# Patient Record
Sex: Female | Born: 2000 | Race: Black or African American | Hispanic: No | Marital: Single | Smoking: Former smoker
Health system: Southern US, Community
[De-identification: ages and names within clinical notes are randomized; demographics above are authoritative.]

## PROBLEM LIST (undated history)

## (undated) DIAGNOSIS — A6000 Herpesviral infection of urogenital system, unspecified: Secondary | ICD-10-CM

## (undated) HISTORY — PX: SHOULDER SURGERY: SHX246

## (undated) HISTORY — PX: FRACTURE SURGERY: SHX138

---
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
## 2018-02-06 ENCOUNTER — Ambulatory Visit (HOSPITAL_COMMUNITY)
Admission: EM | Admit: 2018-02-06 | Discharge: 2018-02-06 | Disposition: A | Discharge status: Home / Self Care | Payer: Managed Care, Other (non HMO) | Attending: Family Medicine | Admitting: Family Medicine

## 2018-02-06 ENCOUNTER — Encounter (HOSPITAL_COMMUNITY): Payer: Self-pay | Admitting: Emergency Medicine

## 2018-02-06 DIAGNOSIS — N760 Acute vaginitis: Secondary | ICD-10-CM | POA: Diagnosis not present

## 2018-02-06 DIAGNOSIS — B9689 Other specified bacterial agents as the cause of diseases classified elsewhere: Secondary | ICD-10-CM | POA: Insufficient documentation

## 2018-02-06 DIAGNOSIS — Z3202 Encounter for pregnancy test, result negative: Secondary | ICD-10-CM

## 2018-02-06 DIAGNOSIS — Z113 Encounter for screening for infections with a predominantly sexual mode of transmission: Secondary | ICD-10-CM

## 2018-02-06 DIAGNOSIS — Z202 Contact with and (suspected) exposure to infections with a predominantly sexual mode of transmission: Secondary | ICD-10-CM | POA: Insufficient documentation

## 2018-02-06 LAB — POCT PREGNANCY, URINE: Preg Test, Ur: NEGATIVE

## 2018-02-06 NOTE — ED Notes (Signed)
Clean & dirty urine specimens obtained and in lab 

## 2018-02-06 NOTE — ED Provider Notes (Signed)
[facility]    CSN: [id number] Arrival date & time: 02/06/18  1544     History   Chief Complaint Chief Complaint  Patient presents with  . Exposure to STD    HPI Rachel Travis is a 17 y.o. female.   17 year old female comes in for STD testing.  Patient states she was told by her partner that he was  tested positive for HSV.  Patient denies any vaginal discharge, itching, pain.  Denies vaginal lesions.  Denies abdominal pain, nausea, vomiting.  Denies urinary symptoms such as frequency, dysuria, hematuria.  Sexually active with one partner, no condom use.         History reviewed. No pertinent past medical history.  There are no active problems to display for this patient.   History reviewed. No pertinent surgical history.  OB History    No data available       Home Medications    Prior to Admission medications   Medication Sig Start Date End Date Taking? Authorizing Provider  benzonatate (TESSALON) 100 MG capsule Take 1 capsule (100 mg total) by mouth every 8 (eight) hours. 12/25/17   McDonald, Mia A, PA-C  EPINEPHrine 0.3 mg/0.3 mL IJ SOAJ injection Inject 1 mg once into the muscle.    [provider]  ibuprofen ([employer] IBUPROFEN) 200 MG tablet Take 400 mg 2 (two) times daily as needed by mouth for mild pain.    [provider]  PE-Doxylamine-DM-GG-APAP (VICKS DAYQUIL/NYQUIL SEVERE PO) Take 30 mLs 3 (three) times daily as needed by mouth (cough/flu symptoms).    [provider]  promethazine-dextromethorphan (PROMETHAZINE-DM) 6.25-15 MG/5ML syrup Take 5 mLs by mouth 4 (four) times daily as needed for cough. 12/25/17   McDonald, Mia A, PA-C    Family History No family history on file.  Social History Social History   Tobacco Use  . Smoking status: Current Every Day Smoker    Packs/day: 0.15    Types: Cigarettes  . Smokeless tobacco: Never Used  Substance Use Topics  . Alcohol use: No    Frequency: Never  . Drug use: No       Allergies   Peanut-containing drug products   Review of Systems Review of Systems  Reason unable to perform ROS: See HPI as above.     Physical Exam Triage Vital Signs ED Triage Vitals [02/06/18 1637]  Enc Vitals Group     BP 134/81     Pulse Rate 100     Resp 16     Temp 98.3 F (36.8 C)     Temp Source Temporal     SpO2 100 %     Weight 125 lb (56.7 kg)     Height      Head Circumference      Peak Flow      Pain Score 0     Pain Loc      Pain Edu?      Excl. in GC?    No data found.  Updated Vital Signs BP 134/81   Pulse 100   Temp 98.3 F (36.8 C) (Temporal)   Resp 16   Wt 125 lb (56.7 kg)   SpO2 100%   BMI 24.41 kg/m   Physical Exam  Constitutional: She is oriented to person, place, and time. She appears well-developed and well-nourished. No distress.  HENT:  Head: Normocephalic and atraumatic.  Eyes: Conjunctivae are normal. Pupils are equal, round, and reactive to light.  Genitourinary: There is no  rash, tenderness or lesion on the right labia. There is no rash, tenderness or lesion on the left labia.  Neurological: She is alert and oriented to person, place, and time.    UC Treatments / Results  Labs (all labs ordered are listed, but only abnormal results are displayed) Labs Reviewed  HIV ANTIBODY (ROUTINE TESTING)  HSV 1 ANTIBODY, IGG  HSV 2 ANTIBODY, IGG  RPR  POCT PREGNANCY, URINE  URINE CYTOLOGY ANCILLARY ONLY    EKG  EKG Interpretation None       Radiology No results found.  Procedures Procedures (including critical care time)  Medications Ordered in UC Medications - No data to display   Initial Impression / Assessment and Plan / UC Course  I have reviewed the triage vital signs and the nursing notes.  Pertinent labs & imaging results that were available during my care of the patient were reviewed by me and considered in my medical decision making (see chart for details).     Patient requested examination of  the labia for HSV, though denies painful ulceration/lesions. No ulceration/lesions seen. Will provide blood work, discussed that this cannot determine time of infection and does not mean active outbreak, patient expresses understanding. She requested further STD testing. Cytology and blood work sent, patient will be contacted with any positive results that require additional treatment. Patient to refrain from sexual activity for the next 7 days. Return precautions given.    Final Clinical Impressions(s) / UC Diagnoses   Final diagnoses:  Exposure to STD    ED Discharge Orders    None        Lurline IdolYu, Amy V, PA-C 02/06/18 1802

## 2018-02-06 NOTE — ED Triage Notes (Signed)
PT's partner had a blood test for herpes. He was positive for HSV 2. PT has had unprotected sex with him since October. PT has never had an outbreak.

## 2018-02-06 NOTE — Discharge Instructions (Signed)
Urine pregnancy negative. Blood work and cytology sent, you will be contacted with any positive results that requires further treatment. Refrain from sexual activity for the next 7 days. Monitor for any worsening of symptoms, fever, abdominal pain, nausea, vomiting, to follow up for reevaluation. Follow up with GYN for further management and evaluation needed.

## 2018-02-09 LAB — URINE CYTOLOGY ANCILLARY ONLY
Chlamydia: NEGATIVE
Neisseria Gonorrhea: NEGATIVE
Trichomonas: NEGATIVE

## 2018-02-11 LAB — URINE CYTOLOGY ANCILLARY ONLY: CANDIDA VAGINITIS: NEGATIVE

## 2018-02-12 ENCOUNTER — Telehealth (HOSPITAL_COMMUNITY): Payer: Self-pay | Admitting: Emergency Medicine

## 2018-02-13 LAB — HIV ANTIBODY (ROUTINE TESTING W REFLEX): HIV SCREEN 4TH GENERATION: NONREACTIVE

## 2018-02-13 LAB — RPR: RPR Ser Ql: UNDETERMINED

## 2018-02-14 LAB — HSV 2 ANTIBODY, IGG: HSV 2 Glycoprotein G Ab, IgG: 5.74 index — ABNORMAL HIGH (ref 0.00–0.90)

## 2018-02-14 LAB — HSV 1 ANTIBODY, IGG: HSV 1 GLYCOPROTEIN G AB, IGG: 14.2 {index} — AB (ref 0.00–0.90)

## 2018-09-28 ENCOUNTER — Emergency Department (HOSPITAL_COMMUNITY)
Admission: EM | Admit: 2018-09-28 | Discharge: 2018-09-28 | Disposition: A | Discharge status: Home / Self Care | Payer: Managed Care, Other (non HMO) | Attending: Emergency Medicine | Admitting: Emergency Medicine

## 2018-09-28 ENCOUNTER — Encounter (HOSPITAL_COMMUNITY): Payer: Self-pay | Admitting: Emergency Medicine

## 2018-09-28 ENCOUNTER — Emergency Department (HOSPITAL_COMMUNITY): Payer: Managed Care, Other (non HMO)

## 2018-09-28 DIAGNOSIS — R0981 Nasal congestion: Secondary | ICD-10-CM | POA: Insufficient documentation

## 2018-09-28 DIAGNOSIS — F17210 Nicotine dependence, cigarettes, uncomplicated: Secondary | ICD-10-CM | POA: Insufficient documentation

## 2018-09-28 DIAGNOSIS — A6004 Herpesviral vulvovaginitis: Secondary | ICD-10-CM | POA: Diagnosis not present

## 2018-09-28 DIAGNOSIS — Z91010 Allergy to peanuts: Secondary | ICD-10-CM | POA: Insufficient documentation

## 2018-09-28 DIAGNOSIS — J069 Acute upper respiratory infection, unspecified: Secondary | ICD-10-CM | POA: Insufficient documentation

## 2018-09-28 DIAGNOSIS — R05 Cough: Secondary | ICD-10-CM | POA: Diagnosis present

## 2018-09-28 HISTORY — DX: Herpesviral infection of urogenital system, unspecified: A60.00

## 2018-09-28 MED ORDER — HYDROCORTISONE 1 % EX CREA: TOPICAL_CREAM | CUTANEOUS | 0 refills | Status: AC

## 2018-09-28 MED ORDER — VALACYCLOVIR HCL 1 G PO TABS: 1000.0000 mg | ORAL_TABLET | Freq: Two times a day (BID) | ORAL | 0 refills | Status: AC

## 2018-09-28 NOTE — ED Notes (Signed)
Patient given discharge teaching and verbalized understanding. Patient ambulated out of ED with a steady gait. 

## 2018-09-28 NOTE — ED Triage Notes (Signed)
Pt thinks she has the flu. For several days had cough that is productive with greenish phlegm, congestion, chills. Reports chest pains are worse when laying down at night.  Also states that she is having a vaginal Herpes breakout. Was dx with herpes here last year.

## 2018-10-08 NOTE — ED Provider Notes (Signed)
[facility]-EMERGENCY DEPT Provider Note   CSN: [id number] Arrival date & time: 09/28/18  1347     History   Chief Complaint Chief Complaint  Patient presents with  . Cough  . Chills  . Herpes Zoster    HPI Rachel Travis is a 17 y.o. female.  HPI 17 year old female with several complaints.  For the past few days she has had cough which productive for yellow/green sputum.  She is felt congested.  Subjective fever and chills.  She is additionally concerned of genital herpes outbreak.  Known history of the same.  Began having recurrence of symptoms in the past week.  Pain and burning.  Past Medical History:  Diagnosis Date  . Herpes genitalia     There are no active problems to display for this patient.   Past Surgical History:  Procedure Laterality Date  . FRACTURE SURGERY    . SHOULDER SURGERY Left      OB History   None      Home Medications    Prior to Admission medications   Medication Sig Start Date End Date Taking? Authorizing Provider  guaiFENesin (MUCINEX) 600 MG 12 hr tablet Take 600 mg by mouth 2 (two) times daily as needed for cough.   Yes [provider]  benzonatate (TESSALON) 100 MG capsule Take 1 capsule (100 mg total) by mouth every 8 (eight) hours. Patient not taking: Reported on 09/28/2018 12/25/17   McDonald, Mia A, PA-C  EPINEPHrine 0.3 mg/0.3 mL IJ SOAJ injection Inject 1 mg once into the muscle.    [provider]  hydrocortisone cream 1 % Apply to affected area 2 times daily 09/28/18   Raeford Razor, MD  promethazine-dextromethorphan (PROMETHAZINE-DM) 6.25-15 MG/5ML syrup Take 5 mLs by mouth 4 (four) times daily as needed for cough. Patient not taking: Reported on 09/28/2018 12/25/17   McDonald, Pedro Earls A, PA-C  valACYclovir (VALTREX) 1000 MG tablet Take 1 tablet (1,000 mg total) by mouth 2 (two) times daily for 10 days. 09/28/18 10/08/18  Raeford Razor, MD    Family History No family history on  file.  Social History Social History   Tobacco Use  . Smoking status: Current Every Day Smoker    Packs/day: 0.15    Types: Cigarettes  . Smokeless tobacco: Never Used  Substance Use Topics  . Alcohol use: No    Frequency: Never  . Drug use: No     Allergies   Peanut-containing drug products   Review of Systems Review of Systems  All systems reviewed and negative, other than as noted in HPI.  Physical Exam Updated Vital Signs BP 111/63   Pulse 74   Temp 98.4 F (36.9 C) (Oral)   Resp 15   Ht 5' (1.524 m)   Wt 54.4 kg   SpO2 100%   BMI 23.44 kg/m   Physical Exam  Constitutional: She appears well-developed and well-nourished. No distress.  HENT:  Head: Normocephalic and atraumatic.  Eyes: Conjunctivae are normal. Right eye exhibits no discharge. Left eye exhibits no discharge.  Neck: Neck supple.  Cardiovascular: Normal rate, regular rhythm and normal heart sounds. Exam reveals no gallop and no friction rub.  No murmur heard. Pulmonary/Chest: Effort normal and breath sounds normal. No respiratory distress.  Abdominal: Soft. She exhibits no distension. There is no tenderness.  Musculoskeletal: She exhibits no edema or tenderness.  Neurological: She is alert.  Skin: Skin is warm and dry.  Psychiatric: She has a normal mood and affect. Her  behavior is normal. Thought content normal.  Nursing note and vitals reviewed.    ED Treatments / Results  Labs (all labs ordered are listed, but only abnormal results are displayed) Labs Reviewed - No data to display  EKG EKG Interpretation  Date/Time:  Monday September 28 2018 14:05:05 EST Ventricular Rate:  84 PR Interval:    QRS Duration: 77 QT Interval:  339 QTC Calculation: 401 R Axis:   83 Text Interpretation:  Sinus rhythm Minimal ST depression, inferior leads Confirmed by Raeford RazorKohut, Caylie Sandquist [phone]([id number]) on 09/28/2018 4:55:19 PM   Radiology No results found.   Dg Chest 2 View  Result Date: 09/28/2018 CLINICAL  DATA:  Cough, congestion and fever for several days. EXAM: CHEST - 2 VIEW COMPARISON:  12/25/2017 FINDINGS: The cardiomediastinal silhouette is unremarkable. There is no evidence of focal airspace disease, pulmonary edema, suspicious pulmonary nodule/mass, pleural effusion, or pneumothorax. No acute bony abnormalities are identified. IMPRESSION: No active cardiopulmonary disease. Electronically Signed   By: Harmon PierJeffrey  Hu M.D.   On: 09/28/2018 14:20    Procedures Procedures (including critical care time)  Medications Ordered in ED Medications - No data to display   Initial Impression / Assessment and Plan / ED Course  I have reviewed the triage vital signs and the nursing notes.  Pertinent labs & imaging results that were available during my care of the patient were reviewed by me and considered in my medical decision making (see chart for details).     17 year old female with likely viral URI.  Systematic treatment.  Additionally provided with Alphatrex given no history of genital herpes and recurrence of symptoms.  Final Clinical Impressions(s) / ED Diagnoses   Final diagnoses:  Upper respiratory tract infection, unspecified type  Herpes simplex vulvovaginitis    ED Discharge Orders         Ordered    valACYclovir (VALTREX) 1000 MG tablet  2 times daily     09/28/18 1739    hydrocortisone cream 1 %     09/28/18 1739           Raeford RazorKohut, Providencia Hottenstein, MD 10/08/18 1225

## 2021-09-29 ENCOUNTER — Other Ambulatory Visit: Payer: Self-pay

## 2021-09-29 ENCOUNTER — Emergency Department (HOSPITAL_BASED_OUTPATIENT_CLINIC_OR_DEPARTMENT_OTHER)
Admission: EM | Admit: 2021-09-29 | Discharge: 2021-09-29 | Disposition: A | Discharge status: Home / Self Care | Payer: Managed Care, Other (non HMO) | Attending: Emergency Medicine | Admitting: Emergency Medicine

## 2021-09-29 ENCOUNTER — Encounter (HOSPITAL_BASED_OUTPATIENT_CLINIC_OR_DEPARTMENT_OTHER): Payer: Self-pay | Admitting: *Deleted

## 2021-09-29 DIAGNOSIS — R059 Cough, unspecified: Secondary | ICD-10-CM | POA: Insufficient documentation

## 2021-09-29 DIAGNOSIS — R0602 Shortness of breath: Secondary | ICD-10-CM | POA: Diagnosis not present

## 2021-09-29 DIAGNOSIS — Z87891 Personal history of nicotine dependence: Secondary | ICD-10-CM | POA: Diagnosis not present

## 2021-09-29 DIAGNOSIS — M791 Myalgia, unspecified site: Secondary | ICD-10-CM | POA: Diagnosis not present

## 2021-09-29 DIAGNOSIS — R0981 Nasal congestion: Secondary | ICD-10-CM | POA: Insufficient documentation

## 2021-09-29 DIAGNOSIS — R509 Fever, unspecified: Secondary | ICD-10-CM | POA: Diagnosis not present

## 2021-09-29 DIAGNOSIS — Z20822 Contact with and (suspected) exposure to covid-19: Secondary | ICD-10-CM | POA: Insufficient documentation

## 2021-09-29 DIAGNOSIS — Z91010 Allergy to peanuts: Secondary | ICD-10-CM | POA: Insufficient documentation

## 2021-09-29 DIAGNOSIS — R051 Acute cough: Secondary | ICD-10-CM

## 2021-09-29 DIAGNOSIS — R519 Headache, unspecified: Secondary | ICD-10-CM | POA: Diagnosis not present

## 2021-09-29 LAB — RESP PANEL BY RT-PCR (FLU A&B, COVID) ARPGX2
Influenza A by PCR: NEGATIVE
Influenza B by PCR: NEGATIVE
SARS Coronavirus 2 by RT PCR: NEGATIVE

## 2021-09-29 MED ORDER — BENZONATATE 100 MG PO CAPS: 100.0000 mg | ORAL_CAPSULE | Freq: Three times a day (TID) | ORAL | 0 refills | Status: DC

## 2021-09-29 MED ORDER — ACETAMINOPHEN 500 MG PO TABS
1000.0000 mg | ORAL_TABLET | Freq: Once | ORAL | Status: AC
  Administered 2021-09-29: 1000 mg via ORAL
  Filled 2021-09-29: qty 2

## 2021-09-29 MED ORDER — BENZONATATE 100 MG PO CAPS
100.0000 mg | ORAL_CAPSULE | Freq: Once | ORAL | Status: AC
  Administered 2021-09-29: 100 mg via ORAL
  Filled 2021-09-29: qty 1

## 2021-09-29 NOTE — ED Provider Notes (Signed)
[facility] EMERGENCY DEPT Provider Note   CSN: [id number] Arrival date & time: 09/29/21  1459     History Chief Complaint  Patient presents with   Cough   Shortness of Breath    Rachel Travis is a 20 y.o. female.   Cough Associated symptoms: fever, headaches, myalgias and shortness of breath   Associated symptoms: no chest pain and no sore throat   Shortness of Breath Associated symptoms: cough, fever and headaches   Associated symptoms: no chest pain and no sore throat    Patient complains of cough x3 days.  Its been constant, happened intermittently without any obvious chronicity.  It is occasionally productive, associated with body aches, headache, nasal congestion.  She reports that she did feel like it was hard to breathe earlier today prompting her to come to the ED.  No history of asthma or pulmonary disease.  Denies any chest pain.  She has tried Mucinex and Robitussin without any relief at home.  Past Medical History:  Diagnosis Date   Herpes genitalia     There are no problems to display for this patient.   Past Surgical History:  Procedure Laterality Date   FRACTURE SURGERY     SHOULDER SURGERY Left      OB History   No obstetric history on file.     No family history on file.  Social History   Tobacco Use   Smoking status: Former    Packs/day: 0.15    Types: Cigarettes   Smokeless tobacco: Never  Vaping Use   Vaping Use: Never used  Substance Use Topics   Alcohol use: No   Drug use: Yes    Types: Marijuana    Home Medications Prior to Admission medications   Medication Sig Start Date End Date Taking? Authorizing Provider  EPINEPHrine 0.3 mg/0.3 mL IJ SOAJ injection Inject 1 mg once into the muscle.    [provider]  hydrocortisone cream 1 % Apply to affected area 2 times daily 09/28/18   Raeford Razor, MD    Allergies    Peanut-containing drug products  Review of Systems   Review of Systems   Constitutional:  Positive for fever.  HENT:  Positive for congestion. Negative for sore throat.   Respiratory:  Positive for cough and shortness of breath.   Cardiovascular:  Negative for chest pain.  Musculoskeletal:  Positive for myalgias.  Neurological:  Positive for headaches.   Physical Exam Updated Vital Signs BP 133/76 (BP Location: Right Arm)   Pulse 76   Temp 98.3 F (36.8 C)   Resp 18   Ht 5' (1.524 m)   Wt 55.8 kg   SpO2 100%   BMI 24.02 kg/m   Physical Exam Vitals and nursing note reviewed. Exam conducted with a chaperone present.  Constitutional:      General: She is not in acute distress.    Appearance: Normal appearance.  HENT:     Head: Normocephalic and atraumatic.     Nose: Congestion present.     Mouth/Throat:     Pharynx: No posterior oropharyngeal erythema.  Eyes:     General: No scleral icterus.    Extraocular Movements: Extraocular movements intact.     Pupils: Pupils are equal, round, and reactive to light.  Cardiovascular:     Rate and Rhythm: Normal rate and regular rhythm.  Pulmonary:     Effort: Pulmonary effort is normal.     Breath sounds: Normal breath sounds.  Comments: Lungs CTA bilaterally. No accessory muscle use. Speaking in complete sentences.  Abdominal:     General: Abdomen is flat.     Palpations: Abdomen is soft.  Musculoskeletal:     Cervical back: Normal range of motion.  Skin:    Coloration: Skin is not jaundiced.  Neurological:     Mental Status: She is alert. Mental status is at baseline.     Coordination: Coordination normal.  Psychiatric:        Mood and Affect: Mood normal.   ED Results / Procedures / Treatments   Labs (all labs ordered are listed, but only abnormal results are displayed) Labs Reviewed  RESP PANEL BY RT-PCR (FLU A&B, COVID) ARPGX2    EKG None  Radiology No results found.  Procedures Procedures   Medications Ordered in ED Medications  benzonatate (TESSALON) capsule 100 mg (has  no administration in time range)  acetaminophen (TYLENOL) tablet 1,000 mg (has no administration in time range)    ED Course  I have reviewed the triage vital signs and the nursing notes.  Pertinent labs & imaging results that were available during my care of the patient were reviewed by me and considered in my medical decision making (see chart for details).    MDM Rules/Calculators/A&P                           PE and history suspicious for URI. Covid test pending.   No signs of respiratory distress. No hypoxia or tachycardia. Lungs CTA bilaterally. Doubt underlying cardiopulmonary process.  I considered, but think unlikely, dangerous causes of this patient's symptoms to include ACS, CHF or COPD exacerbations, pneumonia, pneumothorax.  Patient is nontoxic appearing and not in need of emergent medical intervention. Patient told to self isolate at home until symptoms subside for 72 hours, and that they will call with the COVID resul   Final Clinical Impression(s) / ED Diagnoses Final diagnoses:  None    Rx / DC Orders ED Discharge Orders     None        Theron Arista, Cordelia Poche 09/29/21 2052    Vanetta Mulders, MD 10/12/21 [phone]

## 2021-09-29 NOTE — Discharge Instructions (Addendum)
You were negative for COVID, flu, RSV.  Please take the cough medicine as needed, you have a type of viral illness.  This will resolve on its own.  Return if things change or worsen.

## 2021-09-29 NOTE — ED Triage Notes (Signed)
Coughing with shortness of breath for 3 days.

## 2021-09-29 NOTE — ED Notes (Signed)
Pt reports decrease in cough after tessalon.

## 2021-10-03 ENCOUNTER — Other Ambulatory Visit: Payer: Self-pay

## 2021-10-03 ENCOUNTER — Emergency Department (HOSPITAL_BASED_OUTPATIENT_CLINIC_OR_DEPARTMENT_OTHER): Payer: 59 | Admitting: Radiology

## 2021-10-03 ENCOUNTER — Emergency Department (HOSPITAL_BASED_OUTPATIENT_CLINIC_OR_DEPARTMENT_OTHER)
Admission: EM | Admit: 2021-10-03 | Discharge: 2021-10-03 | Disposition: A | Discharge status: Home / Self Care | Payer: 59 | Attending: Emergency Medicine | Admitting: Emergency Medicine

## 2021-10-03 ENCOUNTER — Encounter (HOSPITAL_BASED_OUTPATIENT_CLINIC_OR_DEPARTMENT_OTHER): Payer: Self-pay | Admitting: Obstetrics and Gynecology

## 2021-10-03 DIAGNOSIS — R059 Cough, unspecified: Secondary | ICD-10-CM | POA: Diagnosis present

## 2021-10-03 DIAGNOSIS — J020 Streptococcal pharyngitis: Secondary | ICD-10-CM | POA: Insufficient documentation

## 2021-10-03 DIAGNOSIS — Z20822 Contact with and (suspected) exposure to covid-19: Secondary | ICD-10-CM | POA: Diagnosis not present

## 2021-10-03 DIAGNOSIS — Z91010 Allergy to peanuts: Secondary | ICD-10-CM | POA: Insufficient documentation

## 2021-10-03 DIAGNOSIS — Z87891 Personal history of nicotine dependence: Secondary | ICD-10-CM | POA: Diagnosis not present

## 2021-10-03 LAB — RESP PANEL BY RT-PCR (FLU A&B, COVID) ARPGX2
Influenza A by PCR: NEGATIVE
Influenza B by PCR: NEGATIVE
SARS Coronavirus 2 by RT PCR: NEGATIVE

## 2021-10-03 LAB — GROUP A STREP BY PCR: Group A Strep by PCR: DETECTED — AB

## 2021-10-03 MED ORDER — CEPACOL REGULAR STRENGTH 3 MG MT LOZG: 1.0000 | LOZENGE | OROMUCOSAL | 12 refills | Status: AC | PRN

## 2021-10-03 MED ORDER — DEXAMETHASONE SODIUM PHOSPHATE 10 MG/ML IJ SOLN
10.0000 mg | Freq: Once | INTRAMUSCULAR | Status: AC
  Administered 2021-10-03: 10 mg via INTRAVENOUS
  Filled 2021-10-03: qty 1

## 2021-10-03 MED ORDER — PENICILLIN V POTASSIUM 500 MG PO TABS: 500.0000 mg | ORAL_TABLET | Freq: Two times a day (BID) | ORAL | 0 refills | Status: AC

## 2021-10-03 NOTE — ED Provider Notes (Signed)
[facility] EMERGENCY DEPT Provider Note   CSN: [id number] Arrival date & time: 10/03/21  1317     History Chief Complaint  Patient presents with   Cough    Rachel Travis is a 20 y.o. female.  Who presents to the emergency department with right-sided throat pain and ear pain.  She states that for 1 week she has had worsening sore throat and right ear pain.  She states that she feels like she has had difficulty swallowing.  Endorses objective fever at home.  She also endorses headache.  She denies shortness of breath, discharge from her ear, nasal congestion or rhinorrhea, cough.   Cough Associated symptoms: ear pain, fever, headaches and sore throat   Associated symptoms: no rhinorrhea and no shortness of breath       Past Medical History:  Diagnosis Date   Herpes genitalia     There are no problems to display for this patient.   Past Surgical History:  Procedure Laterality Date   FRACTURE SURGERY     SHOULDER SURGERY Left      OB History     Gravida  1   Para  0   Term  0   Preterm  0   AB  0   Living  0      SAB  0   IAB  0   Ectopic  0   Multiple  0   Live Births  0           No family history on file.  Social History   Tobacco Use   Smoking status: Former    Packs/day: 0.15    Types: Cigarettes    Passive exposure: Never   Smokeless tobacco: Never  Vaping Use   Vaping Use: Never used  Substance Use Topics   Alcohol use: No   Drug use: Yes    Types: Marijuana    Home Medications Prior to Admission medications   Medication Sig Start Date End Date Taking? Authorizing Provider  benzonatate (TESSALON) 100 MG capsule Take 1 capsule (100 mg total) by mouth every 8 (eight) hours. 09/29/21   Theron Arista, PA-C  EPINEPHrine 0.3 mg/0.3 mL IJ SOAJ injection Inject 1 mg once into the muscle.    [provider]  hydrocortisone cream 1 % Apply to affected area 2 times daily 09/28/18   Raeford Razor, MD     Allergies    Peanut-containing drug products  Review of Systems   Review of Systems  Constitutional:  Positive for fever.  HENT:  Positive for ear pain and sore throat. Negative for congestion, ear discharge and rhinorrhea.   Respiratory:  Positive for cough. Negative for shortness of breath.   Neurological:  Positive for headaches.  All other systems reviewed and are negative.  Physical Exam Updated Vital Signs BP (!) 131/93   Pulse 74   Temp 98.4 F (36.9 C)   Resp 18   Ht 5' (1.524 m)   Wt 56.7 kg   SpO2 100%   BMI 24.41 kg/m   Physical Exam Vitals and nursing note reviewed.  Constitutional:      Appearance: She is ill-appearing. She is not toxic-appearing.  HENT:     Head: Normocephalic and atraumatic.     Right Ear: Tympanic membrane normal. No tenderness. No mastoid tenderness. Tympanic membrane is not erythematous or bulging.     Left Ear: Tympanic membrane normal. No tenderness. No mastoid tenderness. Tympanic membrane is not erythematous or bulging.  Nose: Nose normal.     Mouth/Throat:     Pharynx: Posterior oropharyngeal erythema present.     Tonsils: No tonsillar exudate or tonsillar abscesses. 2+ on the right. 2+ on the left.  Eyes:     General: No scleral icterus. Cardiovascular:     Pulses: Normal pulses.  Pulmonary:     Effort: Pulmonary effort is normal. No respiratory distress.  Abdominal:     Palpations: Abdomen is soft.  Musculoskeletal:        General: Normal range of motion.     Cervical back: Normal range of motion and neck supple. Tenderness present.  Lymphadenopathy:     Cervical: Cervical adenopathy present.  Skin:    General: Skin is warm and dry.     Capillary Refill: Capillary refill takes less than 2 seconds.     Findings: No rash.  Neurological:     General: No focal deficit present.     Mental Status: She is alert and oriented to person, place, and time.  Psychiatric:        Mood and Affect: Mood normal.         Behavior: Behavior normal.        Thought Content: Thought content normal.        Judgment: Judgment normal.    ED Results / Procedures / Treatments   Labs (all labs ordered are listed, but only abnormal results are displayed) Labs Reviewed  GROUP A STREP BY PCR - Abnormal; Notable for the following components:      Result Value   Group A Strep by PCR DETECTED (*)    All other components within normal limits  RESP PANEL BY RT-PCR (FLU A&B, COVID) ARPGX2    EKG None  Radiology DG Chest 2 View  Result Date: 10/03/2021 CLINICAL DATA:  Cough. EXAM: CHEST - 2 VIEW COMPARISON:  September 28, 2018. FINDINGS: The heart size and mediastinal contours are within normal limits. Both lungs are clear. The visualized skeletal structures are unremarkable. IMPRESSION: No active cardiopulmonary disease. Electronically Signed   By: Lupita Raider M.D.   On: 10/03/2021 14:07    Procedures Procedures   Medications Ordered in ED Medications  dexamethasone (DECADRON) injection 10 mg (10 mg Intravenous Given 10/03/21 1614)    ED Course  I have reviewed the triage vital signs and the nursing notes.  Pertinent labs & imaging results that were available during my care of the patient were reviewed by me and considered in my medical decision making (see chart for details).    MDM Rules/Calculators/A&P 20 year old female who presents emergency department with right-sided throat pain and right ear pain.  Strep positive Triage obtained chest x-ray which was unremarkable.  Patient does not have any respiratory complaints. COVID and flu negative  Her oropharynx is erythematous with bilateral 2+ tonsillar swelling without exudates present.  There is no sign of peritonsillar abscess.  The airway is intact.  Given 10 mg IM Decadron here in the emergency department to reduce swelling. Ear pain is likely referred from her right throat.  Bilateral ears without erythema or abnormalities.  Bilateral mastoids  without redness or swelling.  Will treat with penicillin.  I have also prescribed her Cepacol lozenges for her throat pain.  She is instructed return to emergency department if she has ongoing fevers, or worsening symptoms despite antibiotic course. Final Clinical Impression(s) / ED Diagnoses Final diagnoses:  Strep pharyngitis    Rx / DC Orders ED Discharge Orders  Ordered    penicillin v potassium (VEETID) 500 MG tablet  2 times daily        10/03/21 1600    menthol-cetylpyridinium (CEPACOL REGULAR STRENGTH) 3 MG lozenge  As needed        10/03/21 1603             Cristopher Peru, PA-C 10/03/21 2152    Alvira Monday, MD 10/05/21 1353

## 2021-10-03 NOTE — ED Triage Notes (Signed)
Patient reports to the ER for cough, ear pain on the right side and throat pain

## 2021-10-03 NOTE — Discharge Instructions (Addendum)
You were seen in the emergency department today for sore throat, cough.  While you are here you were diagnosed with strep throat.  We are prescribing you an antibiotic that you should take for the next 10 days.  Please complete antibiotic to its completion.  I am also prescribing you some Cepacol lozenges which may help with the sore throat.  Please continue to push fluids over the next few days.  The swelling should go down after your steroid shot here in the emergency department.  Begin using Robitussin and over-the-counter cough and cold medications may be helpful for your symptoms.  Please take Tylenol for any fevers.  Please return to emergency department if you are having worsening symptoms despite being on antibiotics over the next few days.

## 2021-10-03 NOTE — ED Notes (Signed)
No s/s of reaction to injection noted. Pt stable for d/c.

## 2022-03-04 ENCOUNTER — Encounter (HOSPITAL_COMMUNITY): Payer: Self-pay

## 2022-03-04 ENCOUNTER — Emergency Department (HOSPITAL_COMMUNITY)
Admission: EM | Admit: 2022-03-04 | Discharge: 2022-03-04 | Disposition: A | Discharge status: Home / Self Care | Payer: Managed Care, Other (non HMO) | Attending: Emergency Medicine | Admitting: Emergency Medicine

## 2022-03-04 DIAGNOSIS — R300 Dysuria: Secondary | ICD-10-CM | POA: Insufficient documentation

## 2022-03-04 DIAGNOSIS — R102 Pelvic and perineal pain: Secondary | ICD-10-CM | POA: Insufficient documentation

## 2022-03-04 DIAGNOSIS — R1030 Lower abdominal pain, unspecified: Secondary | ICD-10-CM | POA: Diagnosis present

## 2022-03-04 DIAGNOSIS — Z202 Contact with and (suspected) exposure to infections with a predominantly sexual mode of transmission: Secondary | ICD-10-CM | POA: Diagnosis not present

## 2022-03-04 DIAGNOSIS — N739 Female pelvic inflammatory disease, unspecified: Secondary | ICD-10-CM | POA: Insufficient documentation

## 2022-03-04 DIAGNOSIS — Z91010 Allergy to peanuts: Secondary | ICD-10-CM | POA: Diagnosis not present

## 2022-03-04 DIAGNOSIS — D72829 Elevated white blood cell count, unspecified: Secondary | ICD-10-CM | POA: Insufficient documentation

## 2022-03-04 LAB — URINALYSIS, ROUTINE W REFLEX MICROSCOPIC
Bilirubin Urine: NEGATIVE
Glucose, UA: NEGATIVE mg/dL
Hgb urine dipstick: NEGATIVE
Ketones, ur: NEGATIVE mg/dL
Nitrite: NEGATIVE
Protein, ur: NEGATIVE mg/dL
Specific Gravity, Urine: 1.019 (ref 1.005–1.030)
WBC, UA: >50 WBC/hpf — ABNORMAL HIGH (ref 0–5)
pH: 5 (ref 5.0–8.0)

## 2022-03-04 LAB — I-STAT BETA HCG BLOOD, ED (MC, WL, AP ONLY): I-stat hCG, quantitative: <5 m[IU]/mL (ref <5)

## 2022-03-04 LAB — COMPREHENSIVE METABOLIC PANEL
ALT: 14 U/L (ref 0–44)
AST: 19 U/L (ref 15–41)
Albumin: 4.6 g/dL (ref 3.5–5.0)
Alkaline Phosphatase: 62 U/L (ref 38–126)
Anion gap: 5 (ref 5–15)
BUN: 10 mg/dL (ref 6–20)
CO2: 26 mmol/L (ref 22–32)
Calcium: 9 mg/dL (ref 8.9–10.3)
Chloride: 106 mmol/L (ref 98–111)
Creatinine, Ser: 0.51 mg/dL (ref 0.44–1.00)
GFR, Estimated: >60 mL/min (ref >60)
Glucose, Bld: 108 mg/dL — ABNORMAL HIGH (ref 70–99)
Potassium: 3.5 mmol/L (ref 3.5–5.1)
Sodium: 137 mmol/L (ref 135–145)
Total Bilirubin: 0.6 mg/dL (ref 0.3–1.2)
Total Protein: 7.4 g/dL (ref 6.5–8.1)

## 2022-03-04 LAB — CBC WITH DIFFERENTIAL/PLATELET
Abs Immature Granulocytes: 0.01 10*3/uL (ref 0.00–0.07)
Basophils Absolute: 0 10*3/uL (ref 0.0–0.1)
Basophils Relative: 0 %
Eosinophils Absolute: 0.3 10*3/uL (ref 0.0–0.5)
Eosinophils Relative: 5 %
HCT: 42.5 % (ref 36.0–46.0)
Hemoglobin: 13.8 g/dL (ref 12.0–15.0)
Immature Granulocytes: 0 %
Lymphocytes Relative: 31 %
Lymphs Abs: 1.8 10*3/uL (ref 0.7–4.0)
MCH: 29.7 pg (ref 26.0–34.0)
MCHC: 32.5 g/dL (ref 30.0–36.0)
MCV: 91.4 fL (ref 80.0–100.0)
Monocytes Absolute: 0.3 10*3/uL (ref 0.1–1.0)
Monocytes Relative: 5 %
Neutro Abs: 3.5 10*3/uL (ref 1.7–7.7)
Neutrophils Relative %: 59 %
Platelets: 245 10*3/uL (ref 150–400)
RBC: 4.65 MIL/uL (ref 3.87–5.11)
RDW: 13.1 % (ref 11.5–15.5)
WBC: 5.9 10*3/uL (ref 4.0–10.5)
nRBC: 0 % (ref 0.0–0.2)

## 2022-03-04 LAB — WET PREP, GENITAL
Clue Cells Wet Prep HPF POC: NONE SEEN
Sperm: NONE SEEN
Trich, Wet Prep: NONE SEEN
WBC, Wet Prep HPF POC: <10 (ref <10)
Yeast Wet Prep HPF POC: NONE SEEN

## 2022-03-04 LAB — RAPID HIV SCREEN (HIV 1/2 AB+AG)
HIV 1/2 Antibodies: NONREACTIVE
HIV-1 P24 Antigen - HIV24: NONREACTIVE

## 2022-03-04 MED ORDER — DOXYCYCLINE HYCLATE 100 MG PO CAPS: 100.0000 mg | ORAL_CAPSULE | Freq: Two times a day (BID) | ORAL | 0 refills | Status: AC

## 2022-03-04 MED ORDER — DOXYCYCLINE HYCLATE 100 MG PO TABS
100.0000 mg | ORAL_TABLET | Freq: Once | ORAL | Status: AC
  Administered 2022-03-04: 100 mg via ORAL
  Filled 2022-03-04: qty 1

## 2022-03-04 MED ORDER — DOXYCYCLINE HYCLATE 100 MG PO CAPS: 100.0000 mg | ORAL_CAPSULE | Freq: Two times a day (BID) | ORAL | 0 refills | Status: DC

## 2022-03-04 MED ORDER — CEFTRIAXONE SODIUM 250 MG IJ SOLR: 250.0000 mg | Freq: Once | INTRAMUSCULAR | Status: DC

## 2022-03-04 MED ORDER — LIDOCAINE HCL 1 % IJ SOLN
INTRAMUSCULAR | Status: AC
  Administered 2022-03-04: 1 mL
  Filled 2022-03-04: qty 20

## 2022-03-04 MED ORDER — CEFTRIAXONE SODIUM 1 G IJ SOLR
500.0000 mg | Freq: Once | INTRAMUSCULAR | Status: AC
  Administered 2022-03-04: 500 mg via INTRAMUSCULAR
  Filled 2022-03-04: qty 10

## 2022-03-04 NOTE — ED Triage Notes (Signed)
Pt arrived via POV, c/o lower abd pain and requesting STD testing. Pt denies any known exposure. Denies any n/v or diarrhea.  ?

## 2022-03-04 NOTE — ED Provider Triage Note (Signed)
Emergency Medicine Provider Triage Evaluation Note ? ?Rachel Travis , a 21 y.o. female  was evaluated in triage.  Pt complains of possible STD.  Patient states that she had sexual intercourse last week with a female partner.  She states that she found out that he was having sexual intercourse with other partners.  She denies that he has had any symptoms however she has had new vaginal discharge that she describes as yellow and malodorous.  She also complains of vaginal itching.  She complains of dysuria.  She states that she has had some lower abdominal pain particularly on the right side.  She denies any fevers, nausea, vomiting or diarrhea.. ? ?Review of Systems  ?Positive: See above ?Negative:  ? ?Physical Exam  ?BP 115/78 (BP Location: Left Arm)   Pulse 88   Temp 98 ?F (36.7 ?C) (Oral)   Resp 16   SpO2 99%  ?Gen:   Awake, no distress   ?Resp:  Normal effort  ?MSK:   Moves extremities without difficulty  ?Other:  Tenderness to palpation of the right lower quadrant.  Abdomen is soft.  Bowel sounds present. ? ?Medical Decision Making  ?Medically screening exam initiated at 3:19 PM.  Appropriate orders placed.  Rachel Travis was informed that the remainder of the evaluation will be completed by another provider, this initial triage assessment does not replace that evaluation, and the importance of remaining in the ED until their evaluation is complete. ? ? ?  ?Mickie Hillier, PA-C ?03/04/22 1520 ? ?

## 2022-03-04 NOTE — Discharge Instructions (Addendum)
You were evaluated in the Emergency Department and after careful evaluation, we did not find any emergent condition requiring admission or further testing in the hospital. ? ?Your exam/testing today was concerning for developing PID. You have received Rocephin to cover for Gonorrhea. Doxycycline will cover for chlamydia. Return to the ED in the event of severely worsening abdominal pain despite antibiotic use.  ? ?Please return to the Emergency Department if you experience any worsening of your condition.  Thank you for allowing [place] to be a part of your care. ? ?

## 2022-03-04 NOTE — ED Provider Notes (Addendum)
?[facility]-EMERGENCY DEPT ?Provider Note ? ? ?CSN: [id number] ?Arrival date & time: 03/04/22  1443 ? ?  ? ?History ? ?Chief Complaint  ?Patient presents with  ? Abdominal Pain  ? Exposure to STD  ? ? ?Rachel Travis is a 21 y.o. female. ? ? ?Abdominal Pain ?Associated symptoms: dysuria and vaginal discharge   ?Exposure to STD ?Associated symptoms include abdominal pain.  ? ?21 year old female presenting to the emergency department with a chief complaint of possible exposure to STI.  She states that she had sexual intercourse last week with a female partner unprotected.  She found out that he was having intercourse with other partners.  Since then, she has been having increased vaginal discharge that is yellow in foul-smelling.  She complains of itching in her vagina.  She endorses dysuria.  She endorses some bilateral lower abdominal pain with some focality on the right.  She denies any fevers or chills.  She denies any back pain.  She denies any vaginal bleeding. ? ?Home Medications ?Prior to Admission medications   ?Medication Sig Start Date End Date Taking? Authorizing Provider  ?benzonatate (TESSALON) 100 MG capsule Take 1 capsule (100 mg total) by mouth every 8 (eight) hours. 09/29/21   Theron AristaSage, Haley, PA-C  ?doxycycline (VIBRAMYCIN) 100 MG capsule Take 1 capsule (100 mg total) by mouth 2 (two) times daily for 14 days. 03/04/22 03/18/22  Ernie AvenaLawsing, Esgar Barnick, MD  ?EPINEPHrine 0.3 mg/0.3 mL IJ SOAJ injection Inject 1 mg once into the muscle.    [provider]  ?hydrocortisone cream 1 % Apply to affected area 2 times daily 09/28/18   Raeford RazorKohut, Stephen, MD  ?menthol-cetylpyridinium (CEPACOL REGULAR STRENGTH) 3 MG lozenge Take 1 lozenge (3 mg total) by mouth as needed for sore throat. 10/03/21   Cristopher PeruAutry, Lauren E, PA-C  ?   ? ?Allergies    ?Peanut-containing drug products   ? ?Review of Systems   ?Review of Systems  ?Gastrointestinal:  Positive for abdominal pain.  ?Genitourinary:  Positive for dysuria,  pelvic pain and vaginal discharge.  ?All other systems reviewed and are negative. ? ?Physical Exam ?Updated Vital Signs ?BP 110/70 (BP Location: Left Arm)   Pulse 74   Temp 98 ?F (36.7 ?C) (Oral)   Resp 20   SpO2 100%  ?Physical Exam ?Vitals and nursing note reviewed. Exam conducted with a chaperone present.  ?Constitutional:   ?   General: She is not in acute distress. ?   Appearance: She is well-developed.  ?HENT:  ?   Head: Normocephalic and atraumatic.  ?Eyes:  ?   Conjunctiva/sclera: Conjunctivae normal.  ?Cardiovascular:  ?   Rate and Rhythm: Normal rate and regular rhythm.  ?   Heart sounds: No murmur heard. ?Pulmonary:  ?   Effort: Pulmonary effort is normal. No respiratory distress.  ?   Breath sounds: Normal breath sounds.  ?Abdominal:  ?   Palpations: Abdomen is soft.  ?   Tenderness: There is abdominal tenderness in the right lower quadrant, suprapubic area and left lower quadrant.  ?Genitourinary: ?   Cervix: Cervical motion tenderness present. No cervical bleeding.  ?   Adnexa:     ?   Right: Tenderness present. No mass or fullness.      ?   Left: Tenderness present. No mass or fullness.    ?Musculoskeletal:     ?   General: No swelling.  ?   Cervical back: Neck supple.  ?Skin: ?   General: Skin is warm and  dry.  ?   Capillary Refill: Capillary refill takes less than 2 seconds.  ?Neurological:  ?   Mental Status: She is alert.  ?Psychiatric:     ?   Mood and Affect: Mood normal.  ? ? ?ED Results / Procedures / Treatments   ?Labs ?(all labs ordered are listed, but only abnormal results are displayed) ?Labs Reviewed  ?COMPREHENSIVE METABOLIC PANEL - Abnormal; Notable for the following components:  ?    Result Value  ? Glucose, Bld 108 (*)   ? All other components within normal limits  ?URINALYSIS, ROUTINE W REFLEX MICROSCOPIC - Abnormal; Notable for the following components:  ? APPearance HAZY (*)   ? Leukocytes,Ua LARGE (*)   ? WBC, UA >50 (*)   ? Bacteria, UA RARE (*)   ? All other components  within normal limits  ?WET PREP, GENITAL  ?CBC WITH DIFFERENTIAL/PLATELET  ?RAPID HIV SCREEN (HIV 1/2 AB+AG)  ?RPR  ?I-STAT BETA HCG BLOOD, ED ([facility], [facility], [facility] ONLY)  ?GC/CHLAMYDIA PROBE AMP ([place]) NOT AT [facility]  ? ? ?EKG ?None ? ?Radiology ?No results found. ? ?Procedures ?Procedures  ? ? ?Medications Ordered in ED ?Medications  ?doxycycline (VIBRA-TABS) tablet 100 mg (100 mg Oral Given 03/04/22 1804)  ?cefTRIAXone (ROCEPHIN) injection 500 mg (500 mg Intramuscular Given 03/04/22 1803)  ?lidocaine (XYLOCAINE) 1 % (with pres) injection (1 mL  Given 03/04/22 1803)  ? ? ?ED Course/ Medical Decision Making/ A&P ?Clinical Course as of 03/04/22 1938  ?Mon Mar 04, 2022  ?1803 WBC, UA(!): >50 [JL]  ?1803 Bacteria, UA(!): RARE [JL]  ?1803 Leukocytes,Ua(!): LARGE [JL]  ?  ?Clinical Course User Index ?[JL] Ernie Avena, MD  ? ?                        ?Medical Decision Making ?Amount and/or Complexity of Data Reviewed ?Labs: ordered. Decision-making details documented in ED Course. ? ?Risk ?Prescription drug management. ? ? ? ?21 year old female presenting to the emergency department with a chief complaint of possible exposure to STI.  She states that she had sexual intercourse last week with a female partner unprotected.  She found out that he was having intercourse with other partners.  Since then, she has been having increased vaginal discharge that is yellow in foul-smelling.  She complains of itching in her vagina.  She endorses dysuria.  She endorses some bilateral lower abdominal pain with some focality on the right.  She denies any fevers or chills.  She denies any back pain.  She denies any vaginal bleeding. ? ?On arrival, the patient was afebrile, hemodynamically stable, normal sinus rhythm noted on cardiac telemetry.  Physical exam significant for bilateral pelvic tenderness to palpation with a pelvic exam significant for positive cervical motion tenderness, no vaginal bleeding, bilateral adnexal tenderness. ? ?Concern  for STI and developing PID.  The patient consents to STI testing.  HIV and syphilis testing was performed with a negative HIV rapid screen and negative RPR.  Her urinalysis was positive for rare bacteria, large leukocytes with greater than 50 WBCs.  With her complaint of increased vaginal discharge, will empirically cover for gonorrhea chlamydia with 500 mg of IM Rocephin and doxycycline 100 mg twice daily for 14 days.  She denies any anorexia, no nausea or vomiting, is afebrile, has no leukocytosis. Hcg negative. Bilateral lower abdominal tenderness on exam.  Lower suspicion for acute appendicitis.  Wet prep performed and negative for bacterial vaginosis or candidal infection.  Provided return precautions in the event of worsening pain in the right lower quadrant.  Will defer CT imaging or ultrasound imaging at this time based on history and physical exam. Treat empirically for PID with strict return precautions. ? ? ?  ?Final Clinical Impression(s) / ED Diagnoses ?Final diagnoses:  ?Possible exposure to STD  ?PID (pelvic inflammatory disease)  ? ? ?Rx / DC Orders ?ED Discharge Orders   ? ?      Ordered  ?  doxycycline (VIBRAMYCIN) 100 MG capsule  2 times daily,   Status:  Discontinued       ? 03/04/22 1925  ?  doxycycline (VIBRAMYCIN) 100 MG capsule  2 times daily       ? 03/04/22 1934  ? ?  ?  ? ?  ? ? ?  ?Ernie Avena, MD ?03/04/22 1938 ? ?  ?Ernie Avena, MD ?03/04/22 1941 ? ?

## 2022-03-05 LAB — RPR: RPR Ser Ql: NONREACTIVE

## 2022-03-06 LAB — GC/CHLAMYDIA PROBE AMP (~~LOC~~) NOT AT ARMC
Chlamydia: NEGATIVE
Comment: NEGATIVE
Comment: NORMAL
Neisseria Gonorrhea: NEGATIVE

## 2022-07-31 ENCOUNTER — Emergency Department (HOSPITAL_BASED_OUTPATIENT_CLINIC_OR_DEPARTMENT_OTHER): Payer: Medicaid Other

## 2022-07-31 ENCOUNTER — Other Ambulatory Visit: Payer: Self-pay

## 2022-07-31 ENCOUNTER — Encounter (HOSPITAL_BASED_OUTPATIENT_CLINIC_OR_DEPARTMENT_OTHER): Payer: Self-pay | Admitting: Emergency Medicine

## 2022-07-31 ENCOUNTER — Emergency Department (HOSPITAL_BASED_OUTPATIENT_CLINIC_OR_DEPARTMENT_OTHER)
Admission: EM | Admit: 2022-07-31 | Discharge: 2022-07-31 | Disposition: A | Discharge status: Home / Self Care | Payer: Medicaid Other | Attending: Emergency Medicine | Admitting: Emergency Medicine

## 2022-07-31 DIAGNOSIS — A568 Sexually transmitted chlamydial infection of other sites: Secondary | ICD-10-CM | POA: Diagnosis not present

## 2022-07-31 DIAGNOSIS — A64 Unspecified sexually transmitted disease: Secondary | ICD-10-CM

## 2022-07-31 LAB — URINALYSIS, ROUTINE W REFLEX MICROSCOPIC
Bilirubin Urine: NEGATIVE
Glucose, UA: NEGATIVE mg/dL
Hgb urine dipstick: NEGATIVE
Ketones, ur: NEGATIVE mg/dL
Nitrite: NEGATIVE
Protein, ur: NEGATIVE mg/dL
Specific Gravity, Urine: 1.025 (ref 1.005–1.030)
pH: 7 (ref 5.0–8.0)

## 2022-07-31 LAB — WET PREP, GENITAL
Sperm: NONE SEEN
WBC, Wet Prep HPF POC: <10 (ref <10)
Yeast Wet Prep HPF POC: NONE SEEN

## 2022-07-31 LAB — URINALYSIS, MICROSCOPIC (REFLEX): RBC / HPF: NONE SEEN RBC/hpf (ref 0–5)

## 2022-07-31 LAB — PREGNANCY, URINE: Preg Test, Ur: NEGATIVE

## 2022-07-31 MED ORDER — DOXYCYCLINE HYCLATE 100 MG PO CAPS: 100.0000 mg | ORAL_CAPSULE | Freq: Two times a day (BID) | ORAL | 0 refills | Status: DC

## 2022-07-31 MED ORDER — DOXYCYCLINE HYCLATE 100 MG PO TABS
100.0000 mg | ORAL_TABLET | Freq: Once | ORAL | Status: AC
  Administered 2022-07-31: 100 mg via ORAL
  Filled 2022-07-31: qty 1

## 2022-07-31 MED ORDER — ACETAMINOPHEN 500 MG PO TABS
1000.0000 mg | ORAL_TABLET | Freq: Once | ORAL | Status: AC
  Administered 2022-07-31: 1000 mg via ORAL
  Filled 2022-07-31: qty 2

## 2022-07-31 MED ORDER — CEFTRIAXONE SODIUM 1 G IJ SOLR
1.0000 g | Freq: Once | INTRAMUSCULAR | Status: AC
  Administered 2022-07-31: 1 g via INTRAMUSCULAR
  Filled 2022-07-31: qty 10

## 2022-07-31 NOTE — Discharge Instructions (Addendum)
You were seen today for sexually transmitted infection.  Your evaluation is most consistent with mild disease at this time.  We will empirically treat you and recommend you follow-up all results on MyChart.  Thank you obtained participate in your care, we have prescribed you 2 antibiotics, the first you have taken here the second you will need to take over the next 10 days.  Please return with any change in symptoms including fevers chills nausea vomiting syncope shortness of breath.  Follow-up with the health department or return if you have any questions or concerns.

## 2022-07-31 NOTE — ED Provider Notes (Signed)
[facility] EMERGENCY DEPARTMENT Provider Note   CSN: [id number] Arrival date & time: 07/31/22  1610     History Chief Complaint  Patient presents with   Foot Pain   SEXUALLY TRANSMITTED DISEASE    HPI Rachel Travis is a 21 y.o. female presenting for concern for STI.  She states that she has had vaginal discharge, over the past 7 to hours and is concern for sexually transmitted fracture.  Endorses a history of chlamydia in the past and states that this feels very similar.  She states she has a positive exposure. She denies fevers or chills nausea vomiting shortness of breath and she also has pain in her right great toe.  No erythema or redness, she is able to ambulate..   Patient's recorded medical, surgical, social, medication list and allergies were reviewed in the Snapshot window as part of the initial history.   Review of Systems   Review of Systems  Constitutional:  Negative for chills and fever.  HENT:  Negative for ear pain and sore throat.   Eyes:  Negative for pain and visual disturbance.  Respiratory:  Negative for cough and shortness of breath.   Cardiovascular:  Negative for chest pain and palpitations.  Gastrointestinal:  Negative for abdominal pain and vomiting.  Genitourinary:  Positive for vaginal discharge. Negative for dysuria, hematuria and vaginal bleeding.  Musculoskeletal:  Negative for arthralgias and back pain.  Skin:  Negative for color change and rash.  Neurological:  Negative for seizures and syncope.  All other systems reviewed and are negative.   Physical Exam Updated Vital Signs BP (!) 133/90   Pulse 78   Temp 98.2 F (36.8 C) (Oral)   Resp 16   Ht 5' (1.524 m)   Wt 59 kg   LMP 07/16/2022   SpO2 97%   BMI 25.39 kg/m  Physical Exam Vitals and nursing note reviewed.  Constitutional:      General: She is not in acute distress.    Appearance: She is well-developed.  HENT:     Head: Normocephalic and atraumatic.  Eyes:      Conjunctiva/sclera: Conjunctivae normal.  Cardiovascular:     Rate and Rhythm: Normal rate and regular rhythm.     Heart sounds: No murmur heard. Pulmonary:     Effort: Pulmonary effort is normal. No respiratory distress.     Breath sounds: Normal breath sounds.  Abdominal:     General: There is no distension.     Palpations: Abdomen is soft.     Tenderness: There is no abdominal tenderness. There is no right CVA tenderness or left CVA tenderness.  Genitourinary:    Comments: Patient declined. Musculoskeletal:        General: No swelling or tenderness. Normal range of motion.     Cervical back: Neck supple.  Skin:    General: Skin is warm and dry.     Capillary Refill: Capillary refill takes less than 2 seconds.  Neurological:     General: No focal deficit present.     Mental Status: She is alert and oriented to person, place, and time. Mental status is at baseline.     Cranial Nerves: No cranial nerve deficit.  Psychiatric:        Mood and Affect: Mood normal.      ED Course/ Medical Decision Making/ A&P Clinical Course as of 07/31/22 2355  Wed Jul 31, 2022  2008 Confirmed STI exposure, patient to self swab and discharge.  Being treated. [  CC]    Clinical Course User Index [CC] Glyn Ade, MD    Procedures Procedures   Medications Ordered in ED Medications  acetaminophen (TYLENOL) tablet 1,000 mg (1,000 mg Oral Given 07/31/22 2014)  cefTRIAXone (ROCEPHIN) injection 1 g (1 g Intramuscular Given 07/31/22 2014)  doxycycline (VIBRA-TABS) tablet 100 mg (100 mg Oral Given 07/31/22 2014)    Medical Decision Making:    Rachel Travis is a 21 y.o. female who presented to the ED today with confirmed STI exposure and history of similar.  She is having vaginal discharge detailed above.     Patient's presentation is complicated by their history of prior vaginal infections and sexually transmitted diseases..  Patient placed on continuous vitals and telemetry monitoring while in  ED which was reviewed periodically.   Complete initial physical exam performed, notably the patient  was hemodynamically stable in no acute distress.      Reviewed and confirmed nursing documentation for past medical history, family history, social history.   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  She does not appear to clinically have TOA or PID based on her history of present arms feel exam findings.  We will treat her confirmed exposure with ceftriaxone and doxycycline which patient states should be able to afford. Additionally, she is endorsing right foot swelling and pain.  Is located over her great toe.  It is mild and she is able to ambulate.  May be reactive arthritis in the setting of STI or developing gouty arthritis.  Regardless, no evidence of septic arthritis.  Recommend a course of anti-inflammatories and close follow-up with primary care provider.  Patient declined pelvic exam and will self swab for objective evaluation.  Clinical Impression:  1. STI (sexually transmitted infection)      Discharge   Final Clinical Impression(s) / ED Diagnoses Final diagnoses:  STI (sexually transmitted infection)    Rx / DC Orders ED Discharge Orders          Ordered    doxycycline (VIBRAMYCIN) 100 MG capsule  2 times daily        07/31/22 2010              Glyn Ade, MD 07/31/22 2355

## 2022-07-31 NOTE — ED Triage Notes (Signed)
Right foot swelling and pain since yesterday. Denies injury. Pain increases when pt walks, and pt endorses tingling sensation. Pt ambulatory with limp.  Also requesting std testing. Had unprotected sex and now concerned due to white vaginal discharge and pain with urination.

## 2022-08-01 LAB — GC/CHLAMYDIA PROBE AMP (~~LOC~~) NOT AT ARMC
Chlamydia: NEGATIVE
Chlamydia: NEGATIVE
Comment: NEGATIVE
Comment: NORMAL
Comment: NEGATIVE
Comment: NORMAL
Neisseria Gonorrhea: NEGATIVE
Neisseria Gonorrhea: NEGATIVE

## 2022-08-01 LAB — HIV ANTIBODY (ROUTINE TESTING W REFLEX): HIV Screen 4th Generation wRfx: NONREACTIVE

## 2022-08-01 LAB — RPR: RPR Ser Ql: NONREACTIVE

## 2022-10-25 ENCOUNTER — Emergency Department (HOSPITAL_COMMUNITY)
Admission: EM | Admit: 2022-10-25 | Discharge: 2022-10-25 | Disposition: A | Discharge status: Home / Self Care | Payer: Medicaid - Out of State | Attending: Emergency Medicine | Admitting: Emergency Medicine

## 2022-10-25 ENCOUNTER — Encounter (HOSPITAL_COMMUNITY): Payer: Self-pay | Admitting: Emergency Medicine

## 2022-10-25 ENCOUNTER — Other Ambulatory Visit: Payer: Self-pay

## 2022-10-25 ENCOUNTER — Emergency Department (HOSPITAL_COMMUNITY): Payer: Medicaid - Out of State

## 2022-10-25 DIAGNOSIS — J069 Acute upper respiratory infection, unspecified: Secondary | ICD-10-CM | POA: Diagnosis not present

## 2022-10-25 DIAGNOSIS — Z91010 Allergy to peanuts: Secondary | ICD-10-CM | POA: Insufficient documentation

## 2022-10-25 DIAGNOSIS — J9801 Acute bronchospasm: Secondary | ICD-10-CM | POA: Diagnosis not present

## 2022-10-25 DIAGNOSIS — R059 Cough, unspecified: Secondary | ICD-10-CM | POA: Diagnosis present

## 2022-10-25 MED ORDER — DEXAMETHASONE 4 MG PO TABS
8.0000 mg | ORAL_TABLET | Freq: Once | ORAL | Status: AC
  Administered 2022-10-25: 8 mg via ORAL
  Filled 2022-10-25: qty 2

## 2022-10-25 MED ORDER — ALBUTEROL SULFATE HFA 108 (90 BASE) MCG/ACT IN AERS
2.0000 | INHALATION_SPRAY | Freq: Once | RESPIRATORY_TRACT | Status: AC
  Administered 2022-10-25: 2 via RESPIRATORY_TRACT
  Filled 2022-10-25: qty 6.7

## 2022-10-25 NOTE — ED Provider Notes (Signed)
[facility] EMERGENCY DEPARTMENT Provider Note   CSN: [id number] Arrival date & time: 10/25/22  0344     History    Chief Complaint - cough   Rachel Travis is a 21 y.o. female.  The history is provided by the patient.  Cough Severity:  Moderate Onset quality:  Gradual Duration:  1 week Timing:  Intermittent Progression:  Improving Chronicity:  New Smoker: yes   Patient reports about a week ago she started having cough, fevers, body aches she reports the fever & body aches did improve, but she continues to have productive cough.  She reports feeling mildly short of breath.  She is a smoker.  No chest pain.   Patient also requesting STD testing    Home Medications Prior to Admission medications   Medication Sig Start Date End Date Taking? Authorizing Provider  EPINEPHrine 0.3 mg/0.3 mL IJ SOAJ injection Inject 1 mg once into the muscle.    [provider]  hydrocortisone cream 1 % Apply to affected area 2 times daily 09/28/18   Raeford Razor, MD  menthol-cetylpyridinium (CEPACOL REGULAR STRENGTH) 3 MG lozenge Take 1 lozenge (3 mg total) by mouth as needed for sore throat. 10/03/21   Cristopher Peru, PA-C      Allergies    Peanut-containing drug products    Review of Systems   Review of Systems  Respiratory:  Positive for cough.     Physical Exam Updated Vital Signs BP 108/73 (BP Location: Left Arm)   Pulse 85   Temp 98.8 F (37.1 C)   Resp 16   Ht 1.524 m (5')   Wt 68 kg   LMP 09/25/2022   SpO2 99%   BMI 29.29 kg/m  Physical Exam CONSTITUTIONAL: Well developed/well nourished HEAD: Normocephalic/atraumatic EYES: EOMI ENMT: Mucous membranes moist, uvula midline, no erythema or exudate NECK: supple no meningeal signs SPINE/BACK:entire spine nontender CV: S1/S2 noted, no murmurs/rubs/gallops noted LUNGS: Scattered wheeze bilaterally, no acute distress ABDOMEN: soft, nontender NEURO: Pt is awake/alert/appropriate, moves all  extremitiesx4.  No facial droop.   SKIN: warm, color normal PSYCH: no abnormalities of mood noted, alert and oriented to situation  ED Results / Procedures / Treatments   Labs (all labs ordered are listed, but only abnormal results are displayed) Labs Reviewed  GC/CHLAMYDIA PROBE AMP ([place]) NOT AT [facility]    EKG None  Radiology DG Chest 2 View  Result Date: 10/25/2022 CLINICAL DATA:  Cough for 1 week with chest pain and shortness of breath EXAM: CHEST - 2 VIEW COMPARISON:  10/03/2021 FINDINGS: Normal heart size and mediastinal contours. No acute infiltrate or edema. No effusion or pneumothorax. No acute osseous findings. IMPRESSION: Negative chest. Electronically Signed   By: Tiburcio Pea M.D.   On: 10/25/2022 05:01    Procedures Procedures    Medications Ordered in ED Medications  albuterol (VENTOLIN HFA) 108 (90 Base) MCG/ACT inhaler 2 puff (has no administration in time range)  dexamethasone (DECADRON) tablet 8 mg (has no administration in time range)    ED Course/ Medical Decision Making/ A&P                           Medical Decision Making Amount and/or Complexity of Data Reviewed Radiology: ordered.  Risk Prescription drug management.   Patient reports having flulike illness about a week ago now with residual cough.  Given the prevalence of influenza at this time in the community, she likely had  the flu that is improving.  I personally reviewed her x-ray that is negative.  She has scattered wheezing bilaterally.  She will be given albuterol and 1 dose of Decadron. Her vital signs are appropriate. She is safe for discharge home.   Patient also requesting STD testing.  Advised her to follow-up with the health department next time.  Urine studies have been ordered Patient has had multiple ER visits for STD checks       Final Clinical Impression(s) / ED Diagnoses Final diagnoses:  Viral URI with cough  Acute bronchospasm    Rx / DC Orders ED  Discharge Orders     None         Zadie Rhine, MD 10/25/22 0530

## 2022-10-25 NOTE — ED Triage Notes (Signed)
Pt reports cough for week not improving. Fever last Saturday. Denies fevers since. Throat pain as well. Been taking mucinex. Reports feels SOB and chest pain from coughing.

## 2022-10-25 NOTE — ED Notes (Signed)
Pt ambulated to restroom at this time with steady gait to obtain a UA sample

## 2022-10-25 NOTE — ED Notes (Signed)
Pt is requesting to have blood work done and to be tested for std's. ED provider made aware of same

## 2023-06-04 ENCOUNTER — Emergency Department (HOSPITAL_BASED_OUTPATIENT_CLINIC_OR_DEPARTMENT_OTHER)
Admission: EM | Admit: 2023-06-04 | Discharge: 2023-06-04 | Disposition: A | Discharge status: Home / Self Care | Payer: Medicaid Other | Attending: Emergency Medicine | Admitting: Emergency Medicine

## 2023-06-04 ENCOUNTER — Other Ambulatory Visit: Payer: Self-pay

## 2023-06-04 ENCOUNTER — Encounter (HOSPITAL_BASED_OUTPATIENT_CLINIC_OR_DEPARTMENT_OTHER): Payer: Self-pay | Admitting: Emergency Medicine

## 2023-06-04 DIAGNOSIS — R102 Pelvic and perineal pain: Secondary | ICD-10-CM | POA: Diagnosis present

## 2023-06-04 DIAGNOSIS — N946 Dysmenorrhea, unspecified: Secondary | ICD-10-CM | POA: Diagnosis not present

## 2023-06-04 LAB — COMPREHENSIVE METABOLIC PANEL
ALT: 10 U/L (ref 0–44)
AST: 13 U/L — ABNORMAL LOW (ref 15–41)
Albumin: 4.4 g/dL (ref 3.5–5.0)
Alkaline Phosphatase: 56 U/L (ref 38–126)
Anion gap: 7 (ref 5–15)
BUN: 8 mg/dL (ref 6–20)
CO2: 23 mmol/L (ref 22–32)
Calcium: 9.2 mg/dL (ref 8.9–10.3)
Chloride: 107 mmol/L (ref 98–111)
Creatinine, Ser: 0.57 mg/dL (ref 0.44–1.00)
GFR, Estimated: >60 mL/min (ref >60)
Glucose, Bld: 107 mg/dL — ABNORMAL HIGH (ref 70–99)
Potassium: 3.7 mmol/L (ref 3.5–5.1)
Sodium: 137 mmol/L (ref 135–145)
Total Bilirubin: 0.6 mg/dL (ref 0.3–1.2)
Total Protein: 7.1 g/dL (ref 6.5–8.1)

## 2023-06-04 LAB — LIPASE, BLOOD: Lipase: <10 U/L — ABNORMAL LOW (ref 11–51)

## 2023-06-04 LAB — WET PREP, GENITAL
Clue Cells Wet Prep HPF POC: NEGATIVE — AB
Sperm: NONE SEEN
Trich, Wet Prep: NEGATIVE — AB
WBC, Wet Prep HPF POC: <10 (ref <10)
Yeast Wet Prep HPF POC: NEGATIVE — AB

## 2023-06-04 LAB — CBC
HCT: 41 % (ref 36.0–46.0)
Hemoglobin: 13.4 g/dL (ref 12.0–15.0)
MCH: 29.3 pg (ref 26.0–34.0)
MCHC: 32.7 g/dL (ref 30.0–36.0)
MCV: 89.7 fL (ref 80.0–100.0)
Platelets: 283 10*3/uL (ref 150–400)
RBC: 4.57 MIL/uL (ref 3.87–5.11)
RDW: 14.7 % (ref 11.5–15.5)
WBC: 5 10*3/uL (ref 4.0–10.5)
nRBC: 0 % (ref 0.0–0.2)

## 2023-06-04 LAB — URINALYSIS, ROUTINE W REFLEX MICROSCOPIC
Bacteria, UA: NONE SEEN
Bilirubin Urine: NEGATIVE
Glucose, UA: NEGATIVE mg/dL
Ketones, ur: NEGATIVE mg/dL
Leukocytes,Ua: NEGATIVE
Nitrite: NEGATIVE
Specific Gravity, Urine: 1.028 (ref 1.005–1.030)
pH: 6.5 (ref 5.0–8.0)

## 2023-06-04 LAB — PREGNANCY, URINE: Preg Test, Ur: NEGATIVE

## 2023-06-04 MED ORDER — KETOROLAC TROMETHAMINE 60 MG/2ML IM SOLN
60.0000 mg | Freq: Once | INTRAMUSCULAR | Status: AC
  Administered 2023-06-04: 60 mg via INTRAMUSCULAR
  Filled 2023-06-04: qty 2

## 2023-06-04 MED ORDER — ONDANSETRON 4 MG PO TBDP: 4.0000 mg | ORAL_TABLET | Freq: Three times a day (TID) | ORAL | 0 refills | Status: AC | PRN

## 2023-06-04 MED ORDER — IBUPROFEN 800 MG PO TABS: 800.0000 mg | ORAL_TABLET | Freq: Three times a day (TID) | ORAL | 0 refills | Status: AC

## 2023-06-04 MED ORDER — KETOROLAC TROMETHAMINE 60 MG/2ML IM SOLN
30.0000 mg | Freq: Once | INTRAMUSCULAR | Status: AC
  Administered 2023-06-04: 30 mg via INTRAMUSCULAR
  Filled 2023-06-04: qty 2

## 2023-06-04 NOTE — ED Provider Notes (Signed)
[place] EMERGENCY DEPARTMENT AT [facility] Provider Note   CSN: [id number] Arrival date & time: 06/04/23  1529     History  Chief Complaint  Patient presents with   Abdominal Cramping    Rachel Travis is a 22 y.o. female.  HPI Patient reports that she started her menstrual cycle 2 days ago.  She reports she typically does have bad cramping and fairly heavy periods.  However, she reports that her suprapubic discomfort and cramping is more than typical.  She reports she has had some nausea and vomiting associated with the pain.  Patient denies pain or burning with urination.  She reports she is sexually active with 2 partners and uses protection.  She did have some concern however for possible STI with increased pelvic pain with menstrual cycle.    Home Medications Prior to Admission medications   Medication Sig Start Date End Date Taking? Authorizing Provider  ibuprofen (ADVIL) 800 MG tablet Take 1 tablet (800 mg total) by mouth 3 (three) times daily. 06/04/23  Yes Arby Barrette, MD  ondansetron (ZOFRAN-ODT) 4 MG disintegrating tablet Take 1 tablet (4 mg total) by mouth every 8 (eight) hours as needed for nausea or vomiting. 06/04/23  Yes Nakeeta Sebastiani, Lebron Conners, MD  EPINEPHrine 0.3 mg/0.3 mL IJ SOAJ injection Inject 1 mg once into the muscle.    [provider]  hydrocortisone cream 1 % Apply to affected area 2 times daily 09/28/18   Raeford Razor, MD  menthol-cetylpyridinium (CEPACOL REGULAR STRENGTH) 3 MG lozenge Take 1 lozenge (3 mg total) by mouth as needed for sore throat. 10/03/21   Cristopher Peru, PA-C      Allergies    Peanut-containing drug products    Review of Systems   Review of Systems  Physical Exam Updated Vital Signs BP 104/73   Pulse 66   Temp 98.4 F (36.9 C) (Oral)   Resp 18   LMP 06/03/2023   SpO2 95%  Physical Exam Constitutional:      Comments: Alert nontoxic clinically well in appearance.  HENT:     Mouth/Throat:     Pharynx:  Oropharynx is clear.  Eyes:     Extraocular Movements: Extraocular movements intact.  Cardiovascular:     Rate and Rhythm: Normal rate and regular rhythm.  Pulmonary:     Effort: Pulmonary effort is normal.     Breath sounds: Normal breath sounds.  Abdominal:     Comments: Abdomen soft.  Mild to moderate suprapubic discomfort to palpation.  No CVA tenderness to percussion.  Musculoskeletal:        General: No swelling or tenderness. Normal range of motion.     Right lower leg: No edema.     Left lower leg: No edema.  Skin:    General: Skin is warm and dry.  Neurological:     General: No focal deficit present.     Mental Status: She is oriented to person, place, and time.     Coordination: Coordination normal.  Psychiatric:        Mood and Affect: Mood normal.     ED Results / Procedures / Treatments   Labs (all labs ordered are listed, but only abnormal results are displayed) Labs Reviewed  WET PREP, GENITAL - Abnormal; Notable for the following components:      Result Value   Yeast Wet Prep HPF POC NEGATIVE (*)    Trich, Wet Prep NEGATIVE (*)    Clue Cells Wet Prep HPF POC NEGATIVE (*)  All other components within normal limits  LIPASE, BLOOD - Abnormal; Notable for the following components:   Lipase <10 (*)    All other components within normal limits  COMPREHENSIVE METABOLIC PANEL - Abnormal; Notable for the following components:   Glucose, Bld 107 (*)    AST 13 (*)    All other components within normal limits  URINALYSIS, ROUTINE W REFLEX MICROSCOPIC - Abnormal; Notable for the following components:   Hgb urine dipstick LARGE (*)    Protein, ur TRACE (*)    All other components within normal limits  CBC  PREGNANCY, URINE  HIV ANTIBODY (ROUTINE TESTING W REFLEX)  GC/CHLAMYDIA PROBE AMP ([place]) NOT AT [facility]    EKG None  Radiology No results found.  Procedures Procedures    Medications Ordered in ED Medications  ketorolac (TORADOL) injection 30  mg (30 mg Intramuscular Given 06/04/23 1622)  ketorolac (TORADOL) injection 60 mg (60 mg Intramuscular Given 06/04/23 2013)    ED Course/ Medical Decision Making/ A&P                             Medical Decision Making Amount and/or Complexity of Data Reviewed Labs: ordered.  Risk Prescription drug management.   Presents with complaint of central pelvic pain.  She is currently on her menstrual cycle and typically has significant pain with menstrual cycles.  Pain is greater than typical for her.  She is clinically well in appearance.  She has a nonsurgical abdominal examination no CVA tenderness.  Urinalysis has white cells and red cells, where she is currently menstruating.  Pregnancy test negative.  WBC count normal.  At this time I have low suspicion for surgical etiology.  Low suspicion for torsion given central cramping pain.  Will have the patient self swab.  Labs are normal.  No leukocytosis.  Wet prep does not show any trichomoniasis and few WBCs.  Pregnancy test negative.  Urinalysis 11-20 RBCs 6-10 WBCs.  Patient denies pain or burning with urination.  I suspect blood contamination with active menstrual cycle.  At this time will not opt to treat based on symptoms.  Patient is clinically well in appearance.  Diagnostic workup is otherwise stable.  This appears most likely dysmenorrhea which patient has had previously.  She did have some concern for STI with pain more intense than typical with her menstrual cycle.  We have done self swab and at this time will await diagnostic result.  Patient reports she has been using protection and has 2 partner so does not have high suspicion for STI.  I reviewed with the patient the need to follow-up with GYN.  She voices understanding.  Pain control plan will be for ibuprofen 800 mg every 8 hours as needed and Zofran if needed for nausea.  Patient is discharged in well condition.        Final Clinical Impression(s) / ED Diagnoses Final  diagnoses:  Dysmenorrhea  Pelvic pain in female    Rx / DC Orders ED Discharge Orders          Ordered    ibuprofen (ADVIL) 800 MG tablet  3 times daily        06/04/23 2021    ondansetron (ZOFRAN-ODT) 4 MG disintegrating tablet  Every 8 hours PRN        06/04/23 2021              Arby Barrette, MD 06/04/23 2025

## 2023-06-04 NOTE — Discharge Instructions (Signed)
1.  You may take ibuprofen 800 mg every 8 hours for pain.  Take Zofran if needed for nausea. 2.  Your gonorrhea and Chlamydia testing will result in your MyChart.  You can follow-up on these.  You will be called if you have a positive test and the treatment. 3.  See your gynecologist for recheck within the next several days.  Return to the emergency department if you develop a fever, worsening pain or other concerning changes.

## 2023-06-04 NOTE — ED Notes (Signed)
Seen by haley PA in triage

## 2023-06-04 NOTE — ED Triage Notes (Signed)
Severe abdominal cramping, menstrual cramping. Started 2 days ago. N/v due to pain

## 2023-06-05 LAB — HIV ANTIBODY (ROUTINE TESTING W REFLEX): HIV Screen 4th Generation wRfx: NONREACTIVE

## 2023-06-06 LAB — GC/CHLAMYDIA PROBE AMP (~~LOC~~) NOT AT ARMC
Chlamydia: NEGATIVE
Comment: NEGATIVE
Comment: NORMAL
Neisseria Gonorrhea: NEGATIVE

## 2023-06-08 NOTE — ED Notes (Signed)
Patient called regarding test results, reviewed with Dr. Karene Fry and called patient with negative results.

## 2023-08-26 IMAGING — DX DG CHEST 2V
2 series · 2 of 2 positions shown · non-contrast
Comparison: September 28, 2018.

CLINICAL DATA: Cough.

EXAM:
CHEST - 2 VIEW

[chest pa]
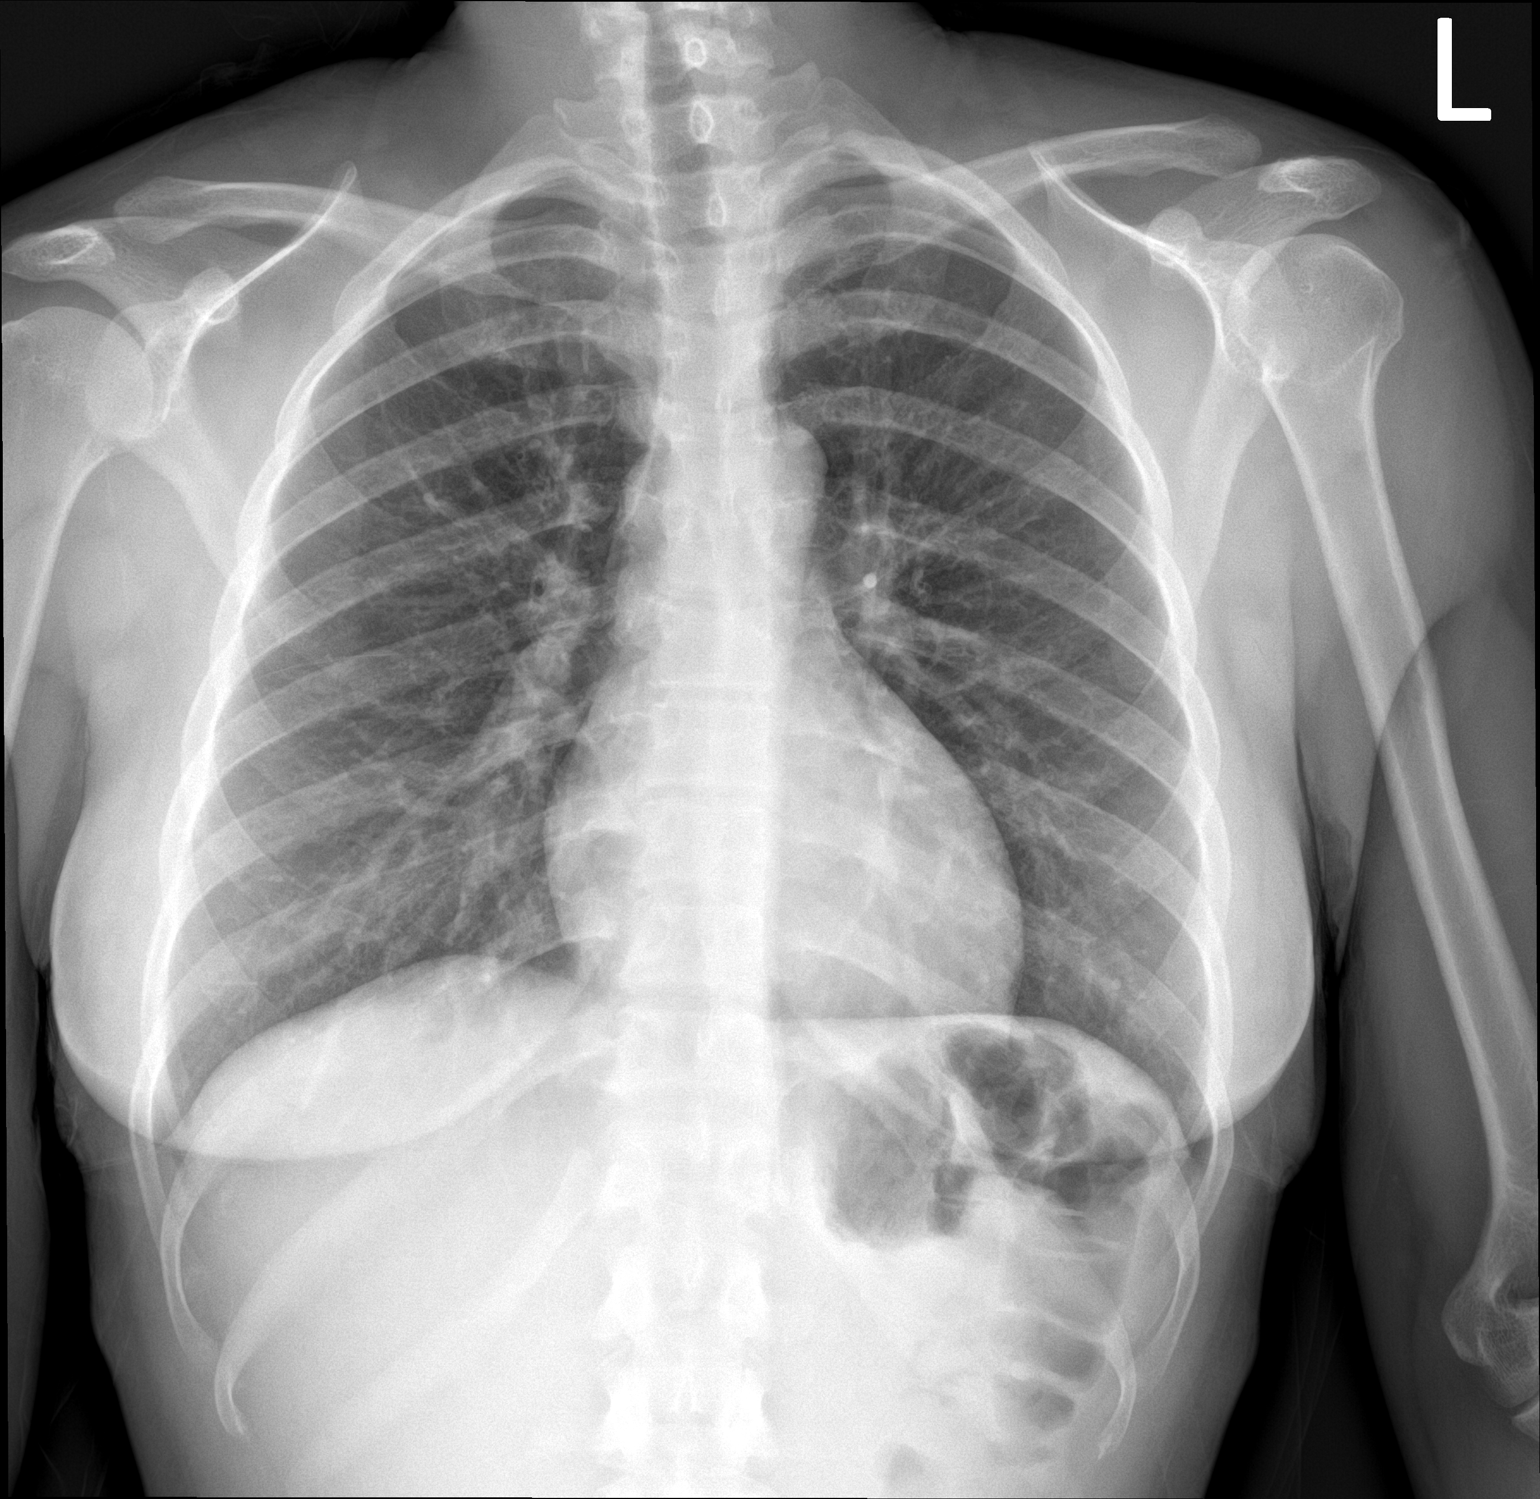

[chest lat]
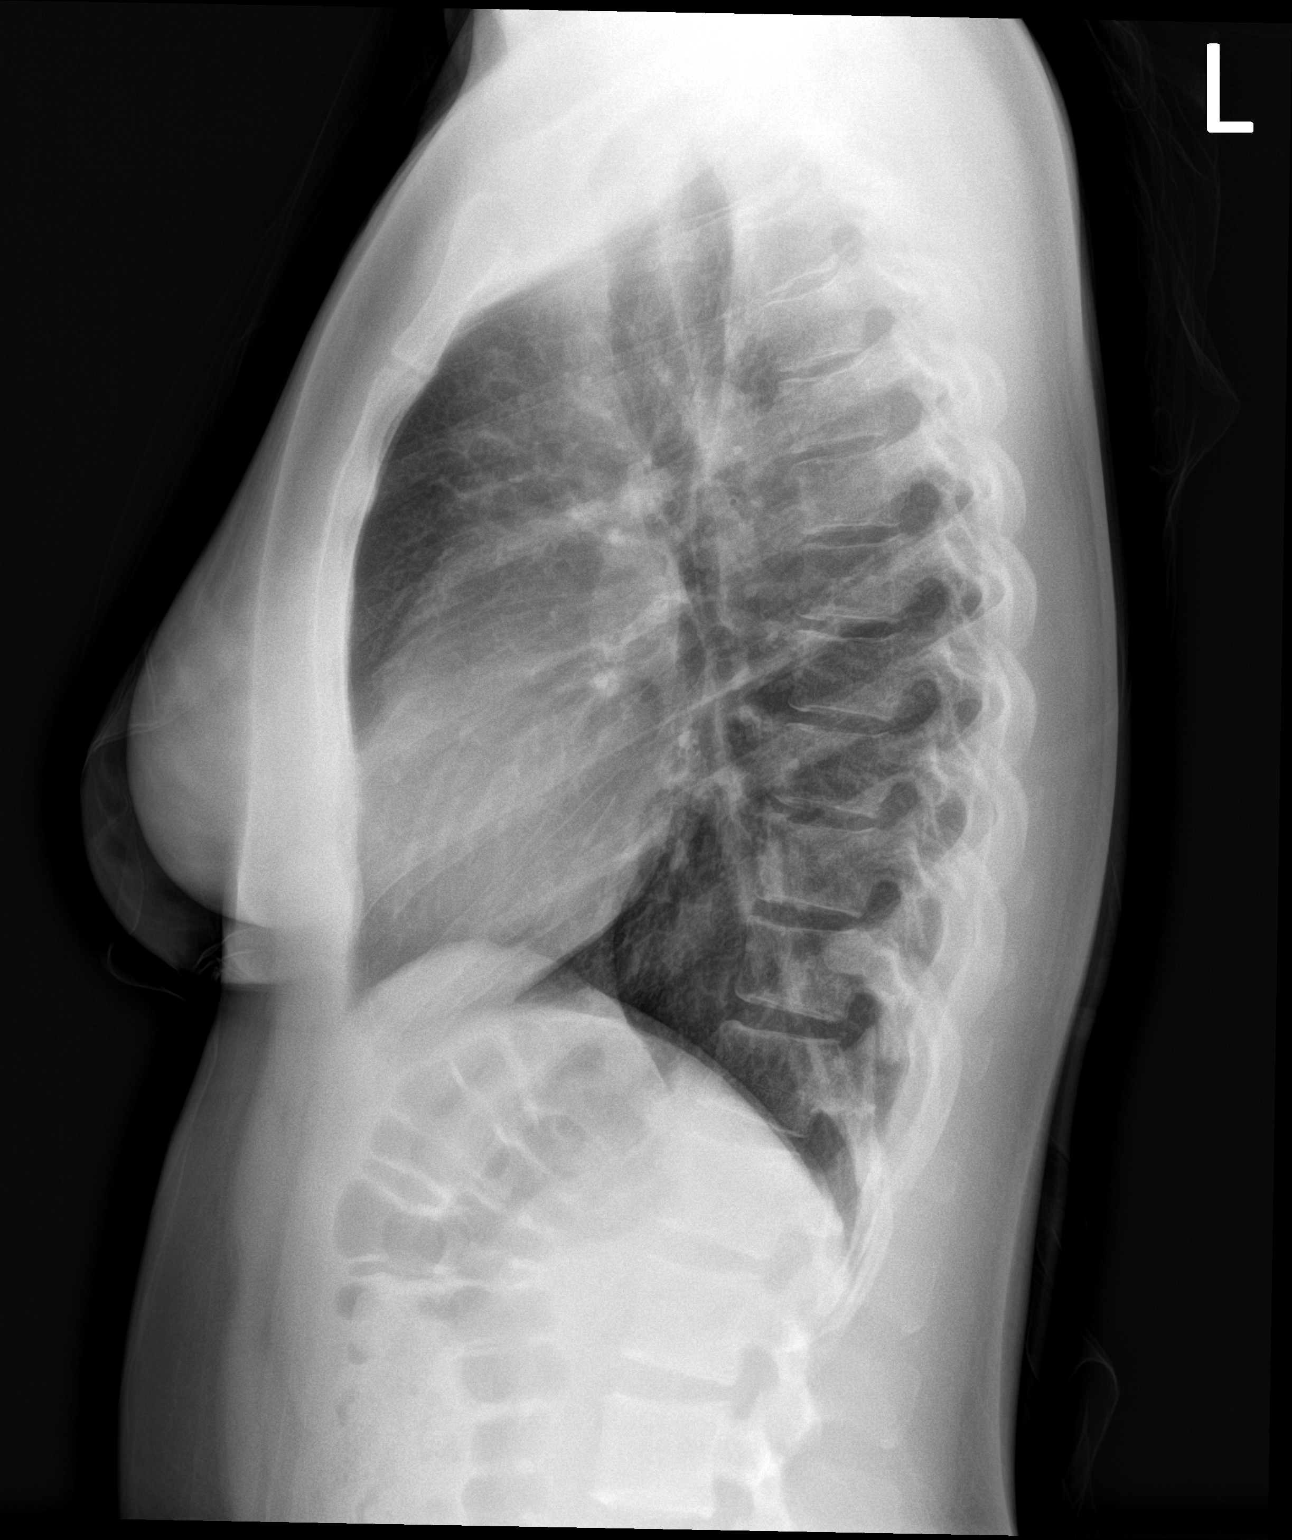

[2 of 2 positions shown; findings below may reference images not displayed]

FINDINGS: The heart size and mediastinal contours are within normal limits.
Both lungs are clear. The visualized skeletal structures are
unremarkable.
IMPRESSION: No active cardiopulmonary disease.
